# Patient Record
Sex: Female | Born: 1995 | Race: White | Hispanic: No | Marital: Single | Smoking: Former smoker
Health system: Southern US, Community
[De-identification: ages and names within clinical notes are randomized; demographics above are authoritative.]

## PROBLEM LIST (undated history)

## (undated) ENCOUNTER — Inpatient Hospital Stay (HOSPITAL_COMMUNITY): Payer: Self-pay

## (undated) DIAGNOSIS — J45909 Unspecified asthma, uncomplicated: Secondary | ICD-10-CM

## (undated) DIAGNOSIS — F419 Anxiety disorder, unspecified: Secondary | ICD-10-CM

## (undated) HISTORY — PX: NO PAST SURGERIES: SHX2092

---
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
## 2014-01-25 ENCOUNTER — Inpatient Hospital Stay (HOSPITAL_COMMUNITY)
Admission: AD | Admit: 2014-01-25 | Discharge: 2014-01-26 | Disposition: A | Discharge status: Home / Self Care | Payer: Medicaid Other | Source: Ambulatory Visit | Attending: Obstetrics and Gynecology | Admitting: Obstetrics and Gynecology

## 2014-01-25 ENCOUNTER — Encounter (HOSPITAL_COMMUNITY): Payer: Self-pay | Admitting: *Deleted

## 2014-01-25 DIAGNOSIS — R102 Pelvic and perineal pain: Secondary | ICD-10-CM

## 2014-01-25 DIAGNOSIS — Z87891 Personal history of nicotine dependence: Secondary | ICD-10-CM | POA: Insufficient documentation

## 2014-01-25 DIAGNOSIS — N949 Unspecified condition associated with female genital organs and menstrual cycle: Secondary | ICD-10-CM | POA: Insufficient documentation
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
## 2014-01-26 DIAGNOSIS — N949 Unspecified condition associated with female genital organs and menstrual cycle: Secondary | ICD-10-CM | POA: Diagnosis not present

## 2014-01-26 DIAGNOSIS — Z87891 Personal history of nicotine dependence: Secondary | ICD-10-CM | POA: Diagnosis not present

## 2014-01-26 NOTE — Discharge Instructions (Signed)
Second Trimester of Pregnancy The second trimester is from week 13 through week 28, months 4 through 6. The second trimester is often a time when you feel your best. Your body has also adjusted to being pregnant, and you begin to feel better physically. Usually, morning sickness has lessened or quit completely, you may have more energy, and you may have an increase in appetite. The second trimester is also a time when the fetus is growing rapidly. At the end of the sixth month, the fetus is about 9 inches long and weighs about 1 pounds. You will likely begin to feel the baby move (quickening) between 18 and 20 weeks of the pregnancy. BODY CHANGES Your body goes through many changes during pregnancy. The changes vary from woman to woman.   Your weight will continue to increase. You will notice your lower abdomen bulging out.  You may begin to get stretch marks on your hips, abdomen, and breasts.  You may develop headaches that can be relieved by medicines approved by your health care provider.  You may urinate more often because the fetus is pressing on your bladder.  You may develop or continue to have heartburn as a result of your pregnancy.  You may develop constipation because certain hormones are causing the muscles that push waste through your intestines to slow down.  You may develop hemorrhoids or swollen, bulging veins (varicose veins).  You may have back pain because of the weight gain and pregnancy hormones relaxing your joints between the bones in your pelvis and as a result of a shift in weight and the muscles that support your balance.  Your breasts will continue to grow and be tender.  Your gums may bleed and may be sensitive to brushing and flossing.  Dark spots or blotches (chloasma, mask of pregnancy) may develop on your face. This will likely fade after the baby is born.  A dark line from your belly button to the pubic area (linea nigra) may appear. This will likely fade  after the baby is born.  You may have changes in your hair. These can include thickening of your hair, rapid growth, and changes in texture. Some women also have hair loss during or after pregnancy, or hair that feels dry or thin. Your hair will most likely return to normal after your baby is born. WHAT TO EXPECT AT YOUR PRENATAL VISITS During a routine prenatal visit:  You will be weighed to make sure you and the fetus are growing normally.  Your blood pressure will be taken.  Your abdomen will be measured to track your baby's growth.  The fetal heartbeat will be listened to.  Any test results from the previous visit will be discussed. Your health care provider may ask you:  How you are feeling.  If you are feeling the baby move.  If you have had any abnormal symptoms, such as leaking fluid, bleeding, severe headaches, or abdominal cramping.  If you have any questions. Other tests that may be performed during your second trimester include:  Blood tests that check for:  Low iron levels (anemia).  Gestational diabetes (between 24 and 28 weeks).  Rh antibodies.  Urine tests to check for infections, diabetes, or protein in the urine.  An ultrasound to confirm the proper growth and development of the baby.  An amniocentesis to check for possible genetic problems.  Fetal screens for spina bifida and Down syndrome. HOME CARE INSTRUCTIONS   Avoid all smoking, herbs, alcohol, and unprescribed   drugs. These chemicals affect the formation and growth of the baby.  Follow your health care provider's instructions regarding medicine use. There are medicines that are either safe or unsafe to take during pregnancy.  Exercise only as directed by your health care provider. Experiencing uterine cramps is a good sign to stop exercising.  Continue to eat regular, healthy meals.  Wear a good support bra for breast tenderness.  Do not use hot tubs, steam rooms, or saunas.  Wear your  seat belt at all times when driving.  Avoid raw meat, uncooked cheese, cat litter boxes, and soil used by cats. These carry germs that can cause birth defects in the baby.  Take your prenatal vitamins.  Try taking a stool softener (if your health care provider approves) if you develop constipation. Eat more high-fiber foods, such as fresh vegetables or fruit and whole grains. Drink plenty of fluids to keep your urine clear or pale yellow.  Take warm sitz baths to soothe any pain or discomfort caused by hemorrhoids. Use hemorrhoid cream if your health care provider approves.  If you develop varicose veins, wear support hose. Elevate your feet for 15 minutes, 3-4 times a day. Limit salt in your diet.  Avoid heavy lifting, wear low heel shoes, and practice good posture.  Rest with your legs elevated if you have leg cramps or low back pain.  Visit your dentist if you have not gone yet during your pregnancy. Use a soft toothbrush to brush your teeth and be gentle when you floss.  A sexual relationship may be continued unless your health care provider directs you otherwise.  Continue to go to all your prenatal visits as directed by your health care provider. SEEK MEDICAL CARE IF:   You have dizziness.  You have mild pelvic cramps, pelvic pressure, or nagging pain in the abdominal area.  You have persistent nausea, vomiting, or diarrhea.  You have a bad smelling vaginal discharge.  You have pain with urination. SEEK IMMEDIATE MEDICAL CARE IF:   You have a fever.  You are leaking fluid from your vagina.  You have spotting or bleeding from your vagina.  You have severe abdominal cramping or pain.  You have rapid weight gain or loss.  You have shortness of breath with chest pain.  You notice sudden or extreme swelling of your face, hands, ankles, feet, or legs.  You have not felt your baby move in over an hour.  You have severe headaches that do not go away with  medicine.  You have vision changes. Document Released: 01/20/2001 Document Revised: 01/31/2013 Document Reviewed: 03/29/2012 ExitCare Patient Information 2015 [employer]. This information is not intended to replace advice given to you by your health care provider. Make sure you discuss any questions you have with your health care provider.  
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
## 2014-10-16 ENCOUNTER — Encounter (HOSPITAL_COMMUNITY): Payer: Self-pay | Admitting: *Deleted

## 2015-06-14 ENCOUNTER — Ambulatory Visit: Payer: Self-pay | Admitting: Allergy and Immunology

## 2015-07-11 ENCOUNTER — Ambulatory Visit (INDEPENDENT_AMBULATORY_CARE_PROVIDER_SITE_OTHER): Payer: Medicaid Other | Admitting: Allergy and Immunology

## 2015-07-11 ENCOUNTER — Encounter: Payer: Self-pay | Admitting: Allergy and Immunology

## 2015-07-11 VITALS — BP 92/68 | HR 84 | Temp 98.8°F | Resp 22 | Ht 65.0 in | Wt 165.0 lb

## 2015-07-11 DIAGNOSIS — J309 Allergic rhinitis, unspecified: Secondary | ICD-10-CM

## 2015-07-11 DIAGNOSIS — J4520 Mild intermittent asthma, uncomplicated: Secondary | ICD-10-CM

## 2015-07-11 DIAGNOSIS — Z7722 Contact with and (suspected) exposure to environmental tobacco smoke (acute) (chronic): Secondary | ICD-10-CM | POA: Diagnosis not present

## 2015-07-11 DIAGNOSIS — R51 Headache: Secondary | ICD-10-CM | POA: Diagnosis not present

## 2015-07-11 DIAGNOSIS — R519 Headache, unspecified: Secondary | ICD-10-CM

## 2015-07-11 DIAGNOSIS — H1010 Acute atopic conjunctivitis, unspecified eye: Secondary | ICD-10-CM

## 2015-07-11 MED ORDER — OLOPATADINE HCL 0.7 % OP SOLN: 1.0000 [drp] | OPHTHALMIC | Status: DC

## 2015-07-11 MED ORDER — MONTELUKAST SODIUM 10 MG PO TABS: 10.0000 mg | ORAL_TABLET | Freq: Every day | ORAL | Status: AC

## 2015-07-11 MED ORDER — ALBUTEROL SULFATE HFA 108 (90 BASE) MCG/ACT IN AERS: 2.0000 | INHALATION_SPRAY | RESPIRATORY_TRACT | Status: AC | PRN

## 2015-07-11 MED ORDER — LORATADINE 10 MG PO TABS: 10.0000 mg | ORAL_TABLET | Freq: Every day | ORAL | Status: AC

## 2015-07-11 MED ORDER — NASONEX 50 MCG/ACT NA SUSP: 2.0000 | Freq: Every day | NASAL | Status: AC

## 2015-07-11 NOTE — Patient Instructions (Addendum)
  1. Allergen avoidance measures  2. Eliminate all indoor and car tobacco smoke exposure  3. Treat and prevent inflammation:   A. Nasonex 1-2 sprays each nostril one time per day  B. montelukast 10 mg one tablet one time per day  4. If needed:   A. loratadine 10 mg one tablet one time per day  B. ProAir HFA 2 puffs every 4-6 hours  C. Pazeo one drop each eye one time per day  5. Slowly taper down all caffeine and chocolate consumption - aim for none  6. Return to clinic in 4 weeks or earlier if problem.

## 2015-07-11 NOTE — Progress Notes (Signed)
Dear Dr. Nedra HaiLee,  Thank you for referring Claire NeuKarrie Albee to [facility] [place] on 07/11/2015.   Below is a summation of this patient's evaluation and recommendations.  Thank you for your referral. I will keep you informed about this patient's response to treatment.   If you have any questions please to do hestitate to contact me.   Sincerely,  Jessica PriestEric J. Kozlow, MD [facility] [place]   ______________________________________________________________________    NEW PATIENT NOTE  Referring Provider: Simone CuriaLee, Keung, MD Primary Provider: Tanna FurryHOUT, BRITTANY, PA-C Date of office visit: 07/11/2015    Subjective:   Chief Complaint:  Claire Petersen (DOB: 04/01/1995) is a 20 y.o. female with a chief complaint of Allergies  who presents to the clinic on 07/11/2015 with the following problems:  HPI: Claire Petersen presents to this clinic in evaluation of persistent respiratory tract symptoms. She has a problem with nasal itchiness and sneezing and nose blowing and nasal congestion and itchy red watery eyes occurring on a perennial basis with some flare up during the spring especially following exposure to the outdoors. She does not have any associated anosmia or ugly nasal discharge. She states that she has received two antibiotics in 2017 for "sinusitis" which is basically what she has described above. She thinks that she has received "lots of antibiotics" last year.  She does have a daily headache in the front and back of her head that she describes as pressure-like. There is no associated scotoma or nausea. She can function with this headache and usually does not require any treatment. She does drink coffee one time per day and has chocolate on occasion.  She also has a history of asthma dating back to early childhood. This apparently has approved tremendously as she has aged. She has intermittent and rare requirement for short  acting bronchodilator averaging out about twice a month. If she exercises she does not get wheezing or coughing but she does get out of breath. She really does not exercise to any large degree. She has not required a systemic steroid for this issue.  She is breast-feeding and will do so with her child for least at least a additional year.  Past Medical History  Diagnosis Date  . Anxiety   . Asthma     as child    Past Surgical History  Procedure Laterality Date  . No past surgeries        Medication List    None    No Known Allergies  Review of systems negative except as noted in HPI / PMHx or noted below:  Review of Systems  Constitutional: Negative.   HENT: Negative.   Eyes: Negative.   Respiratory: Negative.   Cardiovascular: Negative.   Gastrointestinal: Negative.   Genitourinary: Negative.   Musculoskeletal: Negative.   Skin: Negative.   Neurological: Negative.   Endo/Heme/Allergies: Negative.   Psychiatric/Behavioral: Negative.     Family History  Problem Relation Age of Onset  . Hypothyroidism Mother   . Hypertension Father   . Diabetes Maternal Grandmother   . Hypertension Maternal Grandmother   . Hypertension Maternal Grandfather   . Diabetes Maternal Grandfather   . Heart Problems Maternal Grandfather   . Hypertension Paternal Grandfather   . Heart Problems Paternal Grandfather     Social History   Social History  . Marital Status: Single    Spouse Name: N/A  . Number of Children: N/A  .  Years of Education: N/A   Occupational History  . Not on file.   Social History Main Topics  . Smoking status: Former Smoker    Quit date: 06/10/2015  . Smokeless tobacco: Never Used  . Alcohol Use: No  . Drug Use: No  . Sexual Activity: Yes    Birth Control/ Protection: None   Other Topics Concern  . Not on file   Social History Narrative    Environmental and Social history  Lives in a house with a dry environment, a dog located inside the  household, carpeting in the bedroom, no plastic on the bed or pillow, and smoking ongoing with inside the household.   Objective:   Filed Vitals:   07/11/15 1436  BP: 92/68  Pulse: 84  Temp: 98.8 F (37.1 C)  Resp: 22   Height: 5\' 5"  (165.1 cm) Weight: 165 lb (74.844 kg)  Physical Exam  Constitutional: She is well-developed, well-nourished, and in no distress.  Nasal voice  HENT:  Head: Normocephalic. Head is without right periorbital erythema and without left periorbital erythema.  Right Ear: Tympanic membrane, external ear and ear canal normal.  Left Ear: Tympanic membrane, external ear and ear canal normal.  Nose: Mucosal edema present. No rhinorrhea.  Mouth/Throat: Oropharynx is clear and moist and mucous membranes are normal. No oropharyngeal exudate.  Eyes: Conjunctivae and lids are normal. Pupils are equal, round, and reactive to light.  Neck: Trachea normal. No tracheal deviation present. No thyromegaly present.  Cardiovascular: Normal rate, regular rhythm, S1 normal, S2 normal and normal heart sounds.   No murmur heard. Pulmonary/Chest: Effort normal. No stridor. No tachypnea. No respiratory distress. She has no wheezes. She has no rales. She exhibits no tenderness.  Abdominal: Soft. She exhibits no distension and no mass. There is no hepatosplenomegaly. There is no tenderness. There is no rebound and no guarding.  Musculoskeletal: She exhibits no edema or tenderness.  Lymphadenopathy:       Head (right side): No tonsillar adenopathy present.       Head (left side): No tonsillar adenopathy present.    She has no cervical adenopathy.    She has no axillary adenopathy.  Neurological: She is alert. Gait normal.  Skin: No rash noted. She is not diaphoretic. No erythema. No pallor. Nails show no clubbing.  Psychiatric: Mood and affect normal.     Diagnostics: Allergy skin tests were performed. She demonstrated hypersensitivity against house or smoke, cockroach, and maple  tree  Spirometry was performed and demonstrated an FEV1 of 3.56 @ 105 % of predicted.  The patient had an Asthma Control Test with the following results:  .     Assessment and Plan:    1. Allergic rhinoconjunctivitis   2. Asthma, mild intermittent, well-controlled   3. Headache disorder   4. Secondhand smoke exposure     1. Allergen avoidance measures  2. Eliminate all indoor and car tobacco smoke exposure  3. Treat and prevent inflammation:   A. Nasonex 1-2 sprays each nostril one time per day  B. montelukast 10 mg one tablet one time per day  4. If needed:   A. loratadine 10 mg one tablet one time per day  B. ProAir HFA 2 puffs every 4-6 hours  C. Pazeo one drop each eye one time per day  5. Slowly taper down all caffeine and chocolate consumption - aim for none  6. Return to clinic in 4 weeks or earlier if problem.   Tauna should do better  with attention to allergen avoidance measures specially directed against house dust mite and consistent use of some anti-inflammatory medications as noted above. As well, I think she would do better if she tapers down her caffeine and chocolate consumption to address the issue with her chronic headache disorder. Her asthma is very mild and I did give her a short acting bronchodilator to be used as needed. Of course, her boyfriend needs to stop smoking indoors and I've informed her that she needs to have a discussion with him about smoking outdoors. I'll see her back in his clinic In approximately 4 weeks or earlier if there is a problem.  Jessica Priest, MD [facility]

## 2015-08-19 ENCOUNTER — Ambulatory Visit (INDEPENDENT_AMBULATORY_CARE_PROVIDER_SITE_OTHER): Payer: Medicaid Other | Admitting: Allergy and Immunology

## 2015-08-19 ENCOUNTER — Encounter: Payer: Self-pay | Admitting: Allergy and Immunology

## 2015-08-19 VITALS — BP 120/70 | HR 88 | Resp 16

## 2015-08-19 DIAGNOSIS — J309 Allergic rhinitis, unspecified: Secondary | ICD-10-CM

## 2015-08-19 DIAGNOSIS — J4520 Mild intermittent asthma, uncomplicated: Secondary | ICD-10-CM | POA: Diagnosis not present

## 2015-08-19 DIAGNOSIS — H1010 Acute atopic conjunctivitis, unspecified eye: Secondary | ICD-10-CM

## 2015-08-19 DIAGNOSIS — R51 Headache: Secondary | ICD-10-CM

## 2015-08-19 DIAGNOSIS — R519 Headache, unspecified: Secondary | ICD-10-CM

## 2015-08-19 DIAGNOSIS — Z7722 Contact with and (suspected) exposure to environmental tobacco smoke (acute) (chronic): Secondary | ICD-10-CM
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
## 2015-08-19 NOTE — Patient Instructions (Addendum)
  1. Allergen avoidance measures directed against dust mite  2. Eliminate all indoor and car tobacco smoke exposure  3. Treat and prevent inflammation:   A. Nasonex 1-2 sprays each nostril one time per day  B. montelukast 10 mg one tablet one time per day  4. If needed:   A. loratadine 10 mg one tablet one time per day  B. ProAir HFA 2 puffs every 4-6 hours  C. Pazeo one drop each eye one time per day  5. Continue off all caffeine and chocolate consumption    6. Start a course of immunotherapy  7. Return to clinic in 12 weeks or earlier if problem.

## 2015-08-22 ENCOUNTER — Other Ambulatory Visit: Payer: Self-pay | Admitting: Allergy and Immunology

## 2015-08-22 DIAGNOSIS — J309 Allergic rhinitis, unspecified: Principal | ICD-10-CM

## 2015-08-22 DIAGNOSIS — H1010 Acute atopic conjunctivitis, unspecified eye: Secondary | ICD-10-CM

## 2015-08-22 DIAGNOSIS — J3089 Other allergic rhinitis: Secondary | ICD-10-CM | POA: Diagnosis not present

## 2015-08-23 DIAGNOSIS — J301 Allergic rhinitis due to pollen: Secondary | ICD-10-CM | POA: Diagnosis not present

## 2015-09-09 ENCOUNTER — Ambulatory Visit: Payer: Medicaid Other

## 2015-09-12 ENCOUNTER — Ambulatory Visit: Payer: Medicaid Other

## 2015-11-21 ENCOUNTER — Ambulatory Visit (INDEPENDENT_AMBULATORY_CARE_PROVIDER_SITE_OTHER): Payer: Medicaid Other | Admitting: Allergy and Immunology

## 2015-11-21 ENCOUNTER — Encounter: Payer: Self-pay | Admitting: Allergy and Immunology

## 2015-11-21 VITALS — BP 94/66 | HR 80 | Resp 16

## 2015-11-21 DIAGNOSIS — K120 Recurrent oral aphthae: Secondary | ICD-10-CM

## 2015-11-21 DIAGNOSIS — R51 Headache: Secondary | ICD-10-CM | POA: Diagnosis not present

## 2015-11-21 DIAGNOSIS — J309 Allergic rhinitis, unspecified: Secondary | ICD-10-CM | POA: Diagnosis not present

## 2015-11-21 DIAGNOSIS — R519 Headache, unspecified: Secondary | ICD-10-CM

## 2015-11-21 DIAGNOSIS — H1010 Acute atopic conjunctivitis, unspecified eye: Secondary | ICD-10-CM

## 2015-11-21 DIAGNOSIS — Z7722 Contact with and (suspected) exposure to environmental tobacco smoke (acute) (chronic): Secondary | ICD-10-CM

## 2015-11-21 DIAGNOSIS — J4520 Mild intermittent asthma, uncomplicated: Secondary | ICD-10-CM | POA: Diagnosis not present

## 2015-11-21 NOTE — Patient Instructions (Addendum)
  1. Continue to perform Allergen avoidance measures directed against dust mite  2. Continue to Eliminate all indoor and car tobacco smoke exposure  3. Continue to Treat and prevent inflammation:   A. Nasonex 1-2 sprays each nostril one time per day  B. montelukast 10 mg one tablet one time per day  4. If needed:   A. loratadine 10 mg one tablet one time per day  B. ProAir HFA 2 puffs every 4-6 hours  C. Pazeo one drop each eye one time per day  5. Continue off all caffeine and chocolate consumption    6. Obtain fall flu vaccine  7. Return to clinic in 12 weeks or earlier if problem.

## 2015-11-21 NOTE — Progress Notes (Signed)
Follow-up Note  Referring Provider: Tanna FurryHout, Brittany, PA-C Primary Provider: Tanna FurryHOUT, BRITTANY, PA-C Date of Office Visit: 11/21/2015  Subjective:   Claire Petersen (DOB: Oct 05, 1995) is a 20 y.o. female who [facility] 11/21/2015 in re-evaluation of the following:  HPI: Claire Petersen presents this clinic in reevaluation of her allergic rhinoconjunctivitis, intermittent asthma, headache disorder, and secondhand smoke exposure. I last saw her in his clinic in July 2017.  Asthma has not been an issue. She rarely uses a short acting bronchodilator and has not required a systemic steroid to treat an exacerbation.  Her nose has been doing very well. She continues to use a nasal steroid and montelukast. She has not required an antibiotic to treat an episode of sinusitis.  While she remains off chocolate and caffeine she has not had any headaches.  She has broken off her relationship with her boyfriend and is no longer exposured to smoke.  Over the course of the past 24-48 hours she has developed some left face pain down into her jaw and there is some tenderness and a small spot that she can feel when she rubs her left jaw. She's not had any other associated systemic or constitutional symptoms.    Medication List      albuterol 108 (90 Base) MCG/ACT inhaler Commonly known as:  PROAIR HFA Inhale 2 puffs into the lungs every 4 (four) hours as needed for wheezing or shortness of breath.   loratadine 10 MG tablet Commonly known as:  CLARITIN Take 1 tablet (10 mg total) by mouth daily.   montelukast 10 MG tablet Commonly known as:  SINGULAIR Take 1 tablet (10 mg total) by mouth at bedtime.   NASONEX 50 MCG/ACT nasal spray Generic drug:  mometasone Place 2 sprays into the nose daily. Two sprays each in each nostril   Olopatadine HCl 0.7 % Soln Commonly known as:  PAZEO Place 1 drop into both eyes 1 day or 1 dose.   PREDNISONE (PAK) PO Take by mouth. Tapered  dose       Past Medical History:  Diagnosis Date  . Anxiety   . Asthma    as child    Past Surgical History:  Procedure Laterality Date  . NO PAST SURGERIES      No Known Allergies  Review of systems negative except as noted in HPI / PMHx or noted below:  Review of Systems  Constitutional: Negative.   HENT: Negative.   Eyes: Negative.   Respiratory: Negative.   Cardiovascular: Negative.   Gastrointestinal: Negative.   Genitourinary: Negative.   Musculoskeletal: Negative.   Skin: Negative.   Neurological: Negative.   Endo/Heme/Allergies: Negative.   Psychiatric/Behavioral: Negative.      Objective:   Vitals:   11/21/15 1717  BP: 94/66  Pulse: 80  Resp: 16          Physical Exam  Constitutional: She is well-developed, well-nourished, and in no distress.  HENT:  Head: Normocephalic.  Right Ear: Tympanic membrane, external ear and ear canal normal.  Left Ear: Tympanic membrane, external ear and ear canal normal.  Nose: Nose normal. No mucosal edema or rhinorrhea.  Mouth/Throat: Uvula is midline, oropharynx is clear and moist and mucous membranes are normal. No oropharyngeal exudate.  Aphthous ulcer exterior gum left bicuspid  Eyes: Conjunctivae are normal.  Neck: Trachea normal. No tracheal tenderness present. No tracheal deviation present. No thyromegaly present.  Cardiovascular: Normal rate, regular rhythm, S1 normal, S2 normal and  normal heart sounds.   No murmur heard. Pulmonary/Chest: Breath sounds normal. No stridor. No respiratory distress. She has no wheezes. She has no rales.  Musculoskeletal: She exhibits no edema.  Lymphadenopathy:       Head (right side): No tonsillar adenopathy present.       Head (left side): No tonsillar adenopathy present.    She has no cervical adenopathy.  Neurological: She is alert. Gait normal.  Skin: No rash noted. She is not diaphoretic. No erythema. Nails show no clubbing.  Psychiatric: Mood and affect normal.     Diagnostics:    Spirometry was performed and demonstrated an FEV1 of 3.23 at 95 % of predicted.  The patient had an Asthma Control Test with the following results:  .    Assessment and Plan:   1. Asthma, mild intermittent, well-controlled   2. Allergic rhinoconjunctivitis   3. Aphthous stomatitis   4. Headache disorder   5. Secondhand smoke exposure     1. Continue to perform Allergen avoidance measures directed against dust mite  2. Continue to Eliminate all indoor and car tobacco smoke exposure  3. Continue to Treat and prevent inflammation:   A. Nasonex 1-2 sprays each nostril one time per day  B. montelukast 10 mg one tablet one time per day  4. If needed:   A. loratadine 10 mg one tablet one time per day  B. ProAir HFA 2 puffs every 4-6 hours  C. Pazeo one drop each eye one time per day  5. Continue off all caffeine and chocolate consumption    6. Obtain fall flu vaccine  7. Return to clinic in 12 weeks or earlier if problem.   [name] appears to be doing quite well regarding her atopic respiratory disease and appears to be doing quite well regarding her headaches and fortunately her secondhand smoke exposure issue has resolved. She certainly has some type of infection giving rise to an ulcer in her mouth and it interesting to note that her sister just went through a similar type of blistering pharyngitis 2 weeks ago which fortunately appears to be resolving. I'm not going to give her any additional therapy at this point time but she can use some over-the-counter numbing gel to help with the pain and she can take ibuprofen as well. I will see her back in this clinic in 12 weeks or earlier if there is a problem.  Laurette Schimke, MD [facility]

## 2015-12-25 ENCOUNTER — Ambulatory Visit: Payer: Medicaid Other | Admitting: Allergy and Immunology

## 2016-03-02 ENCOUNTER — Ambulatory Visit: Payer: Medicaid Other | Admitting: Allergy and Immunology

## 2016-03-16 ENCOUNTER — Other Ambulatory Visit: Payer: Self-pay | Admitting: *Deleted

## 2016-03-16 MED ORDER — OLOPATADINE HCL 0.1 % OP SOLN: 1.0000 [drp] | Freq: Two times a day (BID) | OPHTHALMIC | 2 refills | Status: AC

## 2016-04-23 ENCOUNTER — Ambulatory Visit: Payer: Medicaid Other | Admitting: Allergy and Immunology

## 2016-08-06 ENCOUNTER — Other Ambulatory Visit: Payer: Self-pay | Admitting: Allergy and Immunology

## 2022-02-10 ENCOUNTER — Emergency Department (HOSPITAL_COMMUNITY)
Admission: EM | Admit: 2022-02-10 | Discharge: 2022-02-10 | Discharge status: Against Medical Advice | Payer: Medicaid Other | Attending: Emergency Medicine | Admitting: Emergency Medicine

## 2022-02-10 ENCOUNTER — Emergency Department (HOSPITAL_COMMUNITY): Payer: Medicaid Other

## 2022-02-10 ENCOUNTER — Other Ambulatory Visit: Payer: Self-pay

## 2022-02-10 ENCOUNTER — Encounter (HOSPITAL_COMMUNITY): Payer: Self-pay | Admitting: Emergency Medicine

## 2022-02-10 DIAGNOSIS — M542 Cervicalgia: Secondary | ICD-10-CM | POA: Diagnosis present

## 2022-02-10 DIAGNOSIS — Z5321 Procedure and treatment not carried out due to patient leaving prior to being seen by health care provider: Secondary | ICD-10-CM | POA: Diagnosis not present

## 2022-02-10 DIAGNOSIS — R202 Paresthesia of skin: Secondary | ICD-10-CM | POA: Insufficient documentation

## 2022-02-10 NOTE — ED Notes (Signed)
Pt left AMA °
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
## 2023-02-17 ENCOUNTER — Ambulatory Visit
Admission: EM | Admit: 2023-02-17 | Discharge: 2023-02-17 | Disposition: A | Discharge status: Home / Self Care | Payer: Self-pay | Attending: Family Medicine | Admitting: Family Medicine

## 2023-02-17 DIAGNOSIS — R070 Pain in throat: Secondary | ICD-10-CM | POA: Insufficient documentation

## 2023-02-17 DIAGNOSIS — J069 Acute upper respiratory infection, unspecified: Secondary | ICD-10-CM | POA: Insufficient documentation

## 2023-02-17 LAB — POCT RAPID STREP A (OFFICE): Rapid Strep A Screen: NEGATIVE

## 2023-02-17 MED ORDER — PREDNISONE 20 MG PO TABS: ORAL_TABLET | ORAL | 0 refills | Status: AC

## 2023-02-17 MED ORDER — PROMETHAZINE-DM 6.25-15 MG/5ML PO SYRP: 5.0000 mL | ORAL_SOLUTION | Freq: Every evening | ORAL | 0 refills | Status: AC | PRN

## 2023-02-17 NOTE — ED Provider Notes (Signed)
 [facility]  Note:  This document was prepared using Conservation officer, historic buildings and may include unintentional dictation errors.  MRN: [id number] DOB: 02-26-1995  Subjective:   Kimberlie Csaszar is a 28 y.o. female presenting for 2 day history of sinus congestion, sinus drainage, throat pain, painful swallowing, body aches. Also has productive cough. No chest pain, shob, wheezing. Has a history of asthma, has not used it because she quit smoking.   No current facility-administered medications for this encounter.  Current Outpatient Medications:    buPROPion (WELLBUTRIN SR) 150 MG 12 hr tablet, Take by mouth., Disp: , Rfl:    butalbital-acetaminophen-caffeine (FIORICET) 50-325-40 MG tablet, Take by mouth., Disp: , Rfl:    Chlorphen-Pseudoephed-APAP (CORICIDIN D PO), Take by mouth., Disp: , Rfl:    etonogestrel-ethinyl estradiol (NUVARING) 0.12-0.015 MG/24HR vaginal ring, See admin instructions., Disp: , Rfl:    lactobacillus acidophilus (BACID) TABS tablet, Take 2 tablets by mouth 3 (three) times daily., Disp: , Rfl:    lamoTRIgine (LAMICTAL) 100 MG tablet, Take by mouth., Disp: , Rfl:    linaclotide (LINZESS) 145 MCG CAPS capsule, Take by mouth., Disp: , Rfl:    Omega-3 Fatty Acids (FISH OIL) 600 MG CAPS, Take by mouth., Disp: , Rfl:    topiramate (TOPAMAX) 25 MG capsule, Take 25 mg by mouth 2 (two) times daily., Disp: , Rfl:    albuterol  (PROAIR  HFA) 108 (90 Base) MCG/ACT inhaler, Inhale 2 puffs into the lungs every 4 (four) hours as needed for wheezing or shortness of breath., Disp: 1 Inhaler, Rfl: 3   COPPER PO, by Intrauterine route., Disp: , Rfl:    loratadine  (CLARITIN ) 10 MG tablet, Take 1 tablet (10 mg total) by mouth daily., Disp: 30 tablet, Rfl: 5   montelukast  (SINGULAIR ) 10 MG tablet, Take 1 tablet (10 mg total) by mouth at bedtime., Disp: 30 tablet, Rfl: 5   NASONEX  50 MCG/ACT nasal spray, Place 2 sprays into the nose daily. Two sprays each in each  nostril, Disp: 17 g, Rfl: 5   olopatadine  (PATANOL) 0.1 % ophthalmic solution, Place 1 drop into both eyes 2 (two) times daily., Disp: 5 mL, Rfl: 2   Allergies  Allergen Reactions   Hydrocodone Rash    Pt denies allergy   Latex Rash   Methylphenidate Rash   Oxycodone Hives   Methylphenidate Hcl Other (See Comments)    Unknown reaction per pt.    Past Medical History:  Diagnosis Date   Anxiety    Asthma    as child     Past Surgical History:  Procedure Laterality Date   NO PAST SURGERIES      Family History  Problem Relation Age of Onset   Hypothyroidism Mother    Hypertension Father    Diabetes Maternal Grandmother    Hypertension Maternal Grandmother    Hypertension Maternal Grandfather    Diabetes Maternal Grandfather    Heart Problems Maternal Grandfather    Hypertension Paternal Grandfather    Heart Problems Paternal Grandfather     Social History   Tobacco Use   Smoking status: Former    Current packs/day: 0.00    Types: Cigarettes    Quit date: 06/10/2015    Years since quitting: 7.6   Smokeless tobacco: Never  Vaping Use   Vaping status: Former  Substance Use Topics   Alcohol use: Yes    Comment: Occa   Drug use: No    ROS   Objective:   Vitals:  BP 119/83 (BP Location: Right Arm)   Pulse 81   Temp 98 F (36.7 C) (Oral)   Resp 18   SpO2 96%   Breastfeeding No   Physical Exam Constitutional:      General: She is not in acute distress.    Appearance: Normal appearance. She is well-developed and normal weight. She is not ill-appearing, toxic-appearing or diaphoretic.  HENT:     Head: Normocephalic and atraumatic.     Right Ear: Tympanic membrane, ear canal and external ear normal. No drainage or tenderness. No middle ear effusion. There is no impacted cerumen. Tympanic membrane is not erythematous or bulging.     Left Ear: Tympanic membrane, ear canal and external ear normal. No drainage or tenderness.  No middle ear effusion. There is no  impacted cerumen. Tympanic membrane is not erythematous or bulging.     Nose: Nose normal. No congestion or rhinorrhea.     Mouth/Throat:     Mouth: Mucous membranes are moist. No oral lesions.     Pharynx: No pharyngeal swelling, oropharyngeal exudate, posterior oropharyngeal erythema or uvula swelling.     Tonsils: No tonsillar exudate or tonsillar abscesses.  Eyes:     General: No scleral icterus.       Right eye: No discharge.        Left eye: No discharge.     Extraocular Movements: Extraocular movements intact.     Right eye: Normal extraocular motion.     Left eye: Normal extraocular motion.     Conjunctiva/sclera: Conjunctivae normal.  Cardiovascular:     Rate and Rhythm: Normal rate and regular rhythm.     Heart sounds: Normal heart sounds. No murmur heard.    No friction rub. No gallop.  Pulmonary:     Effort: Pulmonary effort is normal. No respiratory distress.     Breath sounds: No stridor. No wheezing, rhonchi or rales.  Chest:     Chest wall: No tenderness.  Musculoskeletal:     Cervical back: Normal range of motion and neck supple.  Lymphadenopathy:     Cervical: No cervical adenopathy.  Skin:    General: Skin is warm and dry.  Neurological:     General: No focal deficit present.     Mental Status: She is alert and oriented to person, place, and time.  Psychiatric:        Mood and Affect: Mood normal.        Behavior: Behavior normal.     Results for orders placed or performed during the hospital encounter of 02/17/23 (from the past 24 hours)  POCT rapid strep A     Status: Normal   Collection Time: 02/17/23  2:32 PM  Result Value Ref Range   Rapid Strep A Screen Negative     Assessment and Plan :   PDMP not reviewed this encounter.  1. Viral upper respiratory infection   2. Throat pain    Patient quit smoking about 2 weeks ago.  Recommended an oral prednisone  course in the context together with her history of asthma and respiratory symptoms.  Throat  culture pending.  Recommend general supportive care for a viral upper respiratory infection otherwise.  Counseled patient on potential for adverse effects with medications prescribed/recommended today, ER and return-to-clinic precautions discussed, patient verbalized understanding.    Christopher Savannah, [place] 02/17/23 1513

## 2023-02-17 NOTE — ED Triage Notes (Signed)
 Pt reports sore throat x 2 days; nasal congestion x 1 day; cough started today. Taking coricidin.

## 2023-02-17 NOTE — Discharge Instructions (Addendum)
 We will manage this as a viral respiratory infection. For sore throat or cough try using a honey-based tea. Use 3 teaspoons of honey with juice squeezed from half lemon. Place shaved pieces of ginger into 1/2-1 cup of water and warm over stove top. Then mix the ingredients and repeat every 4 hours as needed. Please take Tylenol 500mg -650mg  once every 6 hours for fevers, aches and pains. Hydrate very well with at least 2 liters (64 ounces) of water. Eat light meals such as soups (chicken and noodles, chicken wild rice, vegetable).  Do not eat any foods that you are allergic to.  Start an antihistamine like Zyrtec (10mg  daily) for postnasal drainage, sinus congestion.  You can take this together with prednisone , a steroid, for 5 days. Use cough syrup at bedtime.

## 2023-02-20 LAB — CULTURE, GROUP A STREP (THRC)
